# Patient Record
Sex: Female | Born: 1990 | Race: White | Hispanic: No | Marital: Single | Smoking: Never smoker
Health system: Southern US, Community
[De-identification: ages and names within clinical notes are randomized; demographics above are authoritative.]

## PROBLEM LIST (undated history)

## (undated) DIAGNOSIS — B399 Histoplasmosis, unspecified: Secondary | ICD-10-CM

## (undated) DIAGNOSIS — J45909 Unspecified asthma, uncomplicated: Secondary | ICD-10-CM

## (undated) HISTORY — DX: Histoplasmosis, unspecified: B39.9

## (undated) HISTORY — DX: Unspecified asthma, uncomplicated: J45.909

---
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
## 2012-08-27 DIAGNOSIS — B399 Histoplasmosis, unspecified: Secondary | ICD-10-CM

## 2012-08-27 HISTORY — DX: Histoplasmosis, unspecified: B39.9

## 2013-01-06 ENCOUNTER — Other Ambulatory Visit: Payer: Self-pay | Admitting: Oncology

## 2013-01-06 ENCOUNTER — Encounter: Payer: Self-pay | Admitting: Family Medicine

## 2013-01-06 ENCOUNTER — Ambulatory Visit (INDEPENDENT_AMBULATORY_CARE_PROVIDER_SITE_OTHER): Payer: 59 | Admitting: Family Medicine

## 2013-01-06 VITALS — BP 92/64 | Temp 98.1°F | Ht 68.25 in | Wt 143.0 lb

## 2013-01-06 DIAGNOSIS — J4520 Mild intermittent asthma, uncomplicated: Secondary | ICD-10-CM | POA: Insufficient documentation

## 2013-01-06 DIAGNOSIS — R222 Localized swelling, mass and lump, trunk: Secondary | ICD-10-CM | POA: Insufficient documentation

## 2013-01-06 DIAGNOSIS — R599 Enlarged lymph nodes, unspecified: Secondary | ICD-10-CM

## 2013-01-06 DIAGNOSIS — J45909 Unspecified asthma, uncomplicated: Secondary | ICD-10-CM

## 2013-01-06 DIAGNOSIS — R591 Generalized enlarged lymph nodes: Secondary | ICD-10-CM | POA: Insufficient documentation

## 2013-01-06 NOTE — Progress Notes (Signed)
Chief Complaint  Patient presents with  . Establish Care    HPI:  Laura Clarke is here to establish care. Recently moved here. 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 -currently feels fine, occ soreness in lymph node in neck and fatigue, but works night shift - feels like node in neck have enlarged a little -wants to see heme/onc here -denies: fevers, chills, weight changes, SOB, anorexia, night sweats, CP, irregular menses, skin rashes, nausea, vomiting, diarrhea, cough -works in health care, travel in the past includes [place], [place], [place] and [place] > 4 years ago   CT scan abd and chest report from 01/31/12: -2.9 cm partially calcified right peritracheal mass/lymph node, few small lymph nodes in left pericaval region up to 7 cm, wispy density 1.5 cm in R upper lobe -CBC and CMP from 01/2012 essentially normal with very mild elevation in t bili  CT neck 11/2011: -enlarged nodes in right supraclavicular space with prominent right paratracheal lymph node.  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 Asthma, mild intermittent 01/06/2013  . Peritracheal mass 01/06/2013  . Lymphadenopathy 01/06/2013   Health Maintenance: -last physical in 2009 with normal pap - has appt with gyn tomorrow  ROS:  See pertinent positives and negatives per HPI.  Past Medical History  Diagnosis Date  . Asthma     Family History  Problem Relation Age of Onset  . Arthritis Maternal Grandfather   . Hyperlipidemia Maternal Grandfather   . Heart disease Maternal Grandfather   . Arthritis Maternal Grandmother   . Hyperlipidemia Maternal Grandmother   . Hypertension Maternal Grandmother   . Hyperlipidemia Mother   . Heart disease Maternal Uncle   . Hypertension Father   . Cancer Father     basal cell skin cancer    History   Social History  . Marital Status: Single    Spouse Name: N/A    Number of Children: N/A  . Years of Education: N/A   Social History Main Topics  . Smoking status: None  . Smokeless tobacco: None  . Alcohol Use: Yes     Comment: 1 drink rarely  . Drug Use: None  . Sexually Active: Not Currently     Comment: never sexually active   Other Topics Concern  . 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 Situation: lives alone      Spiritual Beliefs: Roman Catholic      Lifestyle: goes to the gym about 3-4 times per week, eats healthy             Current outpatient prescriptions:Multiple Vitamins-Minerals (CENTRUM PO), Take by mouth., Disp: , Rfl: ;  albuterol (PROVENTIL HFA) 108 (90 BASE) MCG/ACT inhaler, Inhale 2 puffs into the lungs every 6 (six) hours as needed for wheezing., Disp: , Rfl:   EXAM:  Filed Vitals:   01/06/13 0807  BP: 92/64  Temp: 98.1 F (36.7 C)    Body mass index is 21.57 kg/(m^2).  GENERAL: vitals reviewed and listed above, alert, oriented, appears well hydrated and in no acute distress  HEENT: atraumatic, conjunttiva clear, no obvious abnormalities on inspection of external nose and ears  NECK: R supraclavicular, lowe ant cervical LAD, mobile, non-tender  Lymph: no axillary or other nodes appreciated  LUNGS: clear to auscultation bilaterally, no wheezes, rales or  rhonchi, good air movement  CV: HRRR, no peripheral edema  MS: moves all extremities without noticeable abnormality  PSYCH: pleasant and cooperative, no obvious depression or anxiety  ASSESSMENT AND PLAN:  Discussed the following assessment and plan:  1. Asthma, mild intermittent -will refill albuterol as needed  2.Peritracheal mass - Plan: Ambulatory referral to Hematology / Oncology Lymphadenopathy - Plan: Ambulatory referral to Hematology / Oncology -per patient request will have her see heme onc here for ongoing LAD, fatigue and inconclusive workup in the past -offered imaging and labs here, but she would prefer to wait to see oncologist for this -follow up to let me know how workup goes - will await records, advised she likely will need repeat imaging and lab work -records scanned in   -We reviewed the PMH, PSH, FH, SH, Meds and Allergies. -We provided refills for any medications we will prescribe as needed. -We addressed current concerns per orders and patient instructions. -We have asked for records for pertinent exams, studies, vaccines and notes from previous providers. -We have advised patient to follow up per instructions below.   -Patient advised to return or notify a doctor immediately if symptoms worsen or persist or new concerns arise.  Patient Instructions  -We have ordered labs or studies at this visit. It can take up to 1-2 weeks for results and processing. We will contact you with instructions IF your results are abnormal. Normal results will be released to your [facility]. If you have not heard from [place] or can not find your results in [facility] in 2 weeks please contact our office.  -PLEASE SIGN UP FOR MYCHART TODAY   We recommend the following healthy lifestyle measures: - eat a healthy diet consisting of lots of vegetables, fruits, beans, nuts, seeds, healthy meats such as white chicken and fish and whole grains.  - avoid fried foods, fast food, processed foods,  sodas, red meet and other fattening foods.  - get a least 150 minutes of aerobic exercise per week.   Follow up in:      Kriste Basque R.

## 2013-01-06 NOTE — Patient Instructions (Addendum)
-  We placed a referral for you as discussed to the oncologist. It usually takes about 1-2 weeks to process and schedule this referral. If you have not heard from [place] regarding this appointment in 2 weeks please contact our office.  -PLEASE SIGN UP FOR MYCHART TODAY   We recommend the following healthy lifestyle measures: - eat a healthy diet consisting of lots of vegetables, fruits, beans, nuts, seeds, healthy meats such as white chicken and fish and whole grains.  - avoid fried foods, fast food, processed foods, sodas, red meet and other fattening foods.  - get a least 150 minutes of aerobic exercise per week.   Follow up in: 3 months

## 2013-01-07 ENCOUNTER — Telehealth: Payer: Self-pay | Admitting: Oncology

## 2013-01-07 NOTE — Telephone Encounter (Signed)
LVOM FOR PT TO RETURN CALL IN RE NP APPT. S/w pt in re np appt 06/11 @ 1:30 w/Dr. Arbutus Ped

## 2013-01-23 ENCOUNTER — Other Ambulatory Visit: Payer: Self-pay | Admitting: Oncology

## 2013-01-23 DIAGNOSIS — R591 Generalized enlarged lymph nodes: Secondary | ICD-10-CM

## 2013-01-30 ENCOUNTER — Ambulatory Visit (HOSPITAL_BASED_OUTPATIENT_CLINIC_OR_DEPARTMENT_OTHER): Payer: 59 | Admitting: Oncology

## 2013-01-30 ENCOUNTER — Other Ambulatory Visit (HOSPITAL_BASED_OUTPATIENT_CLINIC_OR_DEPARTMENT_OTHER): Payer: 59 | Admitting: Lab

## 2013-01-30 ENCOUNTER — Telehealth: Payer: Self-pay | Admitting: Oncology

## 2013-01-30 ENCOUNTER — Ambulatory Visit (HOSPITAL_BASED_OUTPATIENT_CLINIC_OR_DEPARTMENT_OTHER): Payer: 59

## 2013-01-30 ENCOUNTER — Encounter: Payer: Self-pay | Admitting: Oncology

## 2013-01-30 VITALS — BP 119/83 | HR 64 | Temp 97.0°F | Resp 18 | Ht 68.0 in | Wt 143.0 lb

## 2013-01-30 DIAGNOSIS — R599 Enlarged lymph nodes, unspecified: Secondary | ICD-10-CM

## 2013-01-30 DIAGNOSIS — R591 Generalized enlarged lymph nodes: Secondary | ICD-10-CM

## 2013-01-30 LAB — CBC WITH DIFFERENTIAL/PLATELET
Basophils Absolute: 0 10*3/uL (ref 0.0–0.1)
EOS%: 0.8 % (ref 0.0–7.0)
HCT: 40.3 % (ref 34.8–46.6)
HGB: 13.9 g/dL (ref 11.6–15.9)
MCH: 30.3 pg (ref 25.1–34.0)
MCV: 88 fL (ref 79.5–101.0)
MONO%: 7.4 % (ref 0.0–14.0)
NEUT%: 57.7 % (ref 38.4–76.8)
Platelets: 184 10*3/uL (ref 145–400)
lymph#: 2.6 10*3/uL (ref 0.9–3.3)

## 2013-01-30 LAB — COMPREHENSIVE METABOLIC PANEL (CC13)
AST: 19 U/L (ref 5–34)
BUN: 14 mg/dL (ref 7.0–26.0)
Calcium: 9.8 mg/dL (ref 8.4–10.4)
Chloride: 104 mEq/L (ref 98–107)
Creatinine: 0.8 mg/dL (ref 0.6–1.1)

## 2013-01-30 NOTE — Progress Notes (Signed)
Checked in new patient. She doesn't have a POA/living will at all. No financial issues.

## 2013-01-30 NOTE — Progress Notes (Signed)
Reason for Referral: Neck adenopathy   HPI: This is a 22 year old woman native of [place] and have relocated to [place] within the last year. She is a healthy woman with past medical history significant for asthma but no chronic comorbid conditions. In November of 2012 she started noticing an enlarged lymph node in the right supraclavicular area. At that time she was living in [place] and underwent an evaluation including ENT visit and a fine needle aspiration of the enlarged lymph nodes. She had a CT scan of the neck without contrast done on 12/07/2011 which showed an aggregation of the enlarged lymph node in the right supraclavicular space associated with a prominent right paratracheal lymph node in the superior mediastinum the largest is measuring 2.6 x 1.6 cm. The superior mediastinal node measuring 1.9 cm. The biopsy was done on 12/18/2011 and was interpreted at [facility] in [place]. The final pathology showed necrotizing granulomatous inflammation without any evidence of any malignancy. At that time and she was noticing symptoms of fatigue tiredness and possibly weight loss. 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 And had a CT scan of the chest abdomen and pelvis done and on June of 2013 which showed a 2.9 cm partially calcified right paratracheal lymph node but otherwise normal CT scan. Per her report she was worked up for Scratch fever on things in her workup was unremarkable. Her laboratory testing was completely normal and her sedimentation rate was normal so is her CBC and chemistries.  Most recently, she relocated to [facility] where she currently working as a Engineer, civil (consulting) at [employer] and establish care with a primary care physician who referred her back here for further evaluation of these enlarged lymph nodes.  Clinically she feels well, she have been able to perform most  activities of daily living without any hindrance or decline. She is working full-time as a Engineer, civil (consulting) without any major difficulties. She does not report any fevers, chills, night sweats, weight loss. She does report slightly enlarged supraclavicular lymph node but has not dramatically changed. She does not report any respiratory or cardiovascular symptoms. And for the most part asymptomatic.   Past Medical History  Diagnosis Date  . Asthma   :  Past Surgical History  Procedure Laterality Date  . Wisdom tooth extraction  2004  . Removal of blocked salivary gland in lip  2008  : Current Outpatient Prescriptions  Medication Sig Dispense Refill  . albuterol (PROVENTIL HFA) 108 (90 BASE) MCG/ACT inhaler Inhale 2 puffs into the lungs every 6 (six) hours as needed for wheezing.      . Multiple Vitamins-Minerals (CENTRUM PO) Take by mouth.       No current facility-administered medications for this visit.      Allergies  Allergen Reactions  . Cefaclor Hives  :  Family History  Problem Relation Age of Onset  . Arthritis Maternal Grandfather   . Hyperlipidemia Maternal Grandfather   . Heart disease Maternal Grandfather   . Arthritis Maternal Grandmother   . Hyperlipidemia Maternal Grandmother   . Hypertension Maternal Grandmother   . Hyperlipidemia Mother   . Heart disease Maternal Uncle   . Hypertension Father   . Cancer Father     basal cell skin cancer  :  History   Social History  . Marital Status: Single    Spouse Name: N/A    Number of Children: N/A  .  Years of Education: N/A   Occupational History  . Not on file.   Social History Main Topics  . Smoking status: Not on file  . Smokeless tobacco: Not on file  . Alcohol Use: Yes     Comment: 1 drink rarely  . Drug Use: Not on file  . Sexually Active: Not Currently     Comment: never sexually active   Other Topics Concern  . 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  Exam: ECOG 0 Blood pressure 119/83, pulse 64, temperature 97 F (36.1 C), temperature source Oral, resp. rate 18, height 5\' 8"  (1.727 m), weight 143 lb (64.864 kg), last menstrual period 12/31/2012. General appearance: alert, cooperative and appears stated age Head: Normocephalic, without obvious abnormality, atraumatic Neck: no adenopathy, no carotid bruit, no JVD, supple, symmetrical, trachea midline and thyroid not enlarged, symmetric, no tenderness/mass/nodules Resp: clear to auscultation bilaterally Chest wall: no tenderness Cardio: regular rate and rhythm, S1, S2 normal, no murmur, click, rub or gallop GI: soft, non-tender; bowel sounds normal; no masses,  no organomegaly Extremities: extremities normal, atraumatic, no cyanosis or edema Pulses: 2+ and symmetric Skin: Skin color, texture, turgor normal. No rashes or lesions Lymph nodes: Cervical, supraclavicular, and axillary nodes normal.   Recent Labs  01/30/13 1037  WBC 7.6  HGB 13.9  HCT 40.3  PLT 184    Recent Labs  01/30/13 1037  NA 139  K 3.8  CL 104  CO2 26  GLUCOSE 79  BUN 14.0  CREATININE 0.8  CALCIUM 9.8    Assessment and Plan:   22 year old woman with chronically enlarged, calcified supraclavicular, mediastinal and paratracheal lymphadenopathy. This is dating back to 09/2010 and a workup including CT scan of the neck as well as CT scan chest abdomen and pelvis failed to show an enlarging lymph glands. She had a fine needle aspiration of the supraclavicular lymph nodes and showed necrotizing granulomatous disease. No evidence of malignancy was found by imaging or pathological criteria. The differential diagnosis was discussed today with Ms. Badger including malignant causes of enlarged  lymph glands such as metastatic head and neck cancer or lymphoma. The most common kind of lymphoma in her age group would be Hodgkin's disease and overall her clinical picture goes against lymphoma in general and Hodgkin's disease specifically. Other things on the differential includes sarcoidosis and other granulomatous disease that could because chronically enlarged lymph glands. Fungal infection would be a possibility although fungal cultures were negative from the last biopsy in April of 2013.  In terms of management: I recommended a repeat CT scan of the neck and chest and compare these lymph glands to the previous CT scan from our year ago. They are stable in size am inclined to continue with observation at this time and they are indeed enlarging or she is developing symptoms, then a more inclined to do a repeat biopsy. That biopsy will be in the form of an excisional neck node biopsy or with the help of a mediastinoscopy for a better diagnosis.  All her questions were answered today and she will followup in  3 months time as she elected to walk more with a conservative approach and repeat CT scan for the time being

## 2013-01-30 NOTE — Telephone Encounter (Signed)
Gave pt appt for Ct June 2014 and lab and MD on September 2014

## 2013-02-04 ENCOUNTER — Ambulatory Visit (HOSPITAL_COMMUNITY)
Admission: RE | Admit: 2013-02-04 | Discharge: 2013-02-04 | Disposition: A | Discharge status: Home / Self Care | Payer: 59 | Source: Ambulatory Visit | Attending: Oncology | Admitting: Oncology

## 2013-02-04 DIAGNOSIS — R5383 Other fatigue: Secondary | ICD-10-CM | POA: Insufficient documentation

## 2013-02-04 DIAGNOSIS — R5381 Other malaise: Secondary | ICD-10-CM | POA: Insufficient documentation

## 2013-02-04 DIAGNOSIS — R591 Generalized enlarged lymph nodes: Secondary | ICD-10-CM

## 2013-02-04 DIAGNOSIS — M542 Cervicalgia: Secondary | ICD-10-CM | POA: Insufficient documentation

## 2013-02-04 DIAGNOSIS — R599 Enlarged lymph nodes, unspecified: Secondary | ICD-10-CM | POA: Insufficient documentation

## 2013-02-04 MED ORDER — IOHEXOL 300 MG/ML  SOLN
80.0000 mL | Freq: Once | INTRAMUSCULAR | Status: AC | PRN
  Administered 2013-02-04: 80 mL via INTRAVENOUS

## 2013-02-05 ENCOUNTER — Telehealth: Payer: Self-pay | Admitting: *Deleted

## 2013-02-05 NOTE — Telephone Encounter (Signed)
Lm informing the pt that her appt time had changed. gv appt d/t for 05/05/13 starting @ 12:45pm. i also made the pt aware that i will mail a letter/cal as well...td

## 2013-05-05 ENCOUNTER — Other Ambulatory Visit: Payer: 59 | Admitting: Lab

## 2013-05-05 ENCOUNTER — Ambulatory Visit (HOSPITAL_BASED_OUTPATIENT_CLINIC_OR_DEPARTMENT_OTHER): Payer: 59 | Admitting: Oncology

## 2013-05-05 ENCOUNTER — Ambulatory Visit: Payer: 59 | Admitting: Oncology

## 2013-05-05 ENCOUNTER — Telehealth: Payer: Self-pay | Admitting: Oncology

## 2013-05-05 ENCOUNTER — Other Ambulatory Visit (HOSPITAL_BASED_OUTPATIENT_CLINIC_OR_DEPARTMENT_OTHER): Payer: 59 | Admitting: Lab

## 2013-05-05 VITALS — BP 111/75 | HR 73 | Temp 97.7°F | Resp 19 | Ht 68.0 in | Wt 138.7 lb

## 2013-05-05 DIAGNOSIS — R591 Generalized enlarged lymph nodes: Secondary | ICD-10-CM

## 2013-05-05 DIAGNOSIS — L980 Pyogenic granuloma: Secondary | ICD-10-CM

## 2013-05-05 LAB — COMPREHENSIVE METABOLIC PANEL (CC13)
ALT: 10 U/L (ref 0–55)
AST: 17 U/L (ref 5–34)
Alkaline Phosphatase: 40 U/L (ref 40–150)
Calcium: 9.4 mg/dL (ref 8.4–10.4)
Chloride: 103 mEq/L (ref 98–109)
Creatinine: 0.8 mg/dL (ref 0.6–1.1)
Total Bilirubin: 1.9 mg/dL — ABNORMAL HIGH (ref 0.20–1.20)

## 2013-05-05 LAB — CBC WITH DIFFERENTIAL/PLATELET
BASO%: 0.6 % (ref 0.0–2.0)
EOS%: 1 % (ref 0.0–7.0)
HCT: 40.5 % (ref 34.8–46.6)
MCH: 30 pg (ref 25.1–34.0)
MCHC: 34 g/dL (ref 31.5–36.0)
NEUT%: 52.7 % (ref 38.4–76.8)
lymph#: 2.9 10*3/uL (ref 0.9–3.3)

## 2013-05-05 NOTE — Progress Notes (Signed)
Hematology and Oncology Follow Up Visit  Laura Clarke [id number] Nov 29, 1990 22 y.o. 05/05/2013 1:02 PM   Principle Diagnosis: 22 year old woman with a borderline lymphadenopathy most likely granulomatous disease and less likely lymphoproliferative disorder.   Current therapy: Observation and surveillance.  Interim History: Laura Clarke presents today for a followup visit. She is a pleasant woman I evaluated initially on June of 2014 with chronic lymphadenopathy the dates back to 2012. She have had a biopsy of a paratracheal lymph node in 2013 which suggested necrotizing granulomatous inflammation without any malignancy. She established care in [place] and presents today for a followup visit. Since her last visit, she is asymptomatic. Has not reported any fevers, chills or weight loss. Has not reported any bulky adenopathy or early satiety. 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  Medications: I have reviewed the patient's current medications.  Current Outpatient Prescriptions  Medication Sig Dispense Refill  . albuterol (PROVENTIL HFA) 108 (90 BASE) MCG/ACT inhaler Inhale 2 puffs into the lungs every 6 (six) hours as needed for wheezing.      . Multiple Vitamins-Minerals (CENTRUM PO) Take by mouth.       No current facility-administered medications for this visit.     Allergies:  Allergies  Allergen Reactions  . Cefaclor Hives    Past Medical History, Surgical history, Social history, and Family History were reviewed and updated.  Review of Systems: Remaining ROS negative. Physical Exam: Blood pressure 111/75, pulse 73, temperature 97.7 F (36.5 C), temperature source Oral, resp. rate 19, height 5\' 8"  (1.727 m), weight 138 lb 11.2 oz (62.914 kg), SpO2 100.00%. ECOG: 0 General appearance: alert Head: Normocephalic, without obvious abnormality, atraumatic Neck: no adenopathy, no carotid bruit, no JVD, supple, symmetrical,  trachea midline and thyroid not enlarged, symmetric, no tenderness/mass/nodules Lymph nodes: Cervical, supraclavicular, and axillary nodes normal. Heart:regular rate and rhythm, S1, S2 normal, no murmur, click, rub or gallop Lung:chest clear, no wheezing, rales, normal symmetric air entry, Heart exam - S1, S2 normal, no murmur, no gallop, rate regular Abdomin: soft, non-tender, without masses or organomegaly EXT:no erythema, induration, or nodules   Lab Results: Lab Results  Component Value Date   WBC 7.4 05/05/2013   HGB 13.8 05/05/2013   HCT 40.5 05/05/2013   MCV 88.2 05/05/2013   PLT 174 05/05/2013     Chemistry      Component Value Date/Time   NA 139 01/30/2013 1037   K 3.8 01/30/2013 1037   CL 104 01/30/2013 1037   CO2 26 01/30/2013 1037   BUN 14.0 01/30/2013 1037   CREATININE 0.8 01/30/2013 1037      Component Value Date/Time   CALCIUM 9.8 01/30/2013 1037   ALKPHOS 45 01/30/2013 1037   AST 19 01/30/2013 1037   ALT 12 01/30/2013 1037   BILITOT 1.39* 01/30/2013 1037       Radiological Studies:  CT NECK AND CHEST WITHOUT CONTRAST  Technique: Multidetector CT imaging of the neck and chest was  performed using the standard protocol without intravenous contrast.  Comparison: None.  CT NECK  Findings: Pharyngeal soft tissues are unremarkable. Airway remains  patent.  Small bilateral level IIA lymph nodes measuring up to 6 mm short  axis (series 8/images and 72), within normal limits.  Visualized brain parenchyma is unremarkable.  The visualized paranasal sinuses are essentially clear. The mastoid  air cells are unopacified.  Cervical spine is within normal limits.  Visualized thyroid is unremarkable.  IMPRESSION:  Small bilateral cervical lymph nodes measuring up to 6 mm short  axis, within normal limits.  CT CHEST  Findings: Lungs are essentially clear. No suspicious pulmonary  nodules. Mild tree-in-bud nodularity in the medial right upper lobe  (series 4/images 20-24), likely  postinfectious/inflammatory. Mild  dependent atelectasis in the left lower lobe. No pleural effusion  or pneumothorax.  The heart is normal in size. No pericardial effusion.  10 mm short axis partially calcified right supraclavicular node  (series 3/image 9). 14 mm short axis partially calcified right  paratracheal node (series 3/image 19). 11 mm short axis partially  calcified right hilar node (series 3/image 26).  Visualized upper abdomen is notable for calcified splenic  granulomata.  Visualized osseous structures are within normal limits.  IMPRESSION:  Borderline enlarged right supraclavicular, right paratracheal, and  right hilar lymph nodes, partially calcified, likely sequela of  prior granulomatous disease.  Associated calcified splenic granulomata.  Impression and Plan:   22 year old woman with chronically enlarged, calcified supraclavicular, mediastinal and paratracheal lymphadenopathy. This is dating back to 09/2010 and a workup including CT scan of the neck as well as CT scan chest abdomen and pelvis failed to show an enlarging lymph glands. She had a fine needle aspiration of the supraclavicular lymph nodes and showed necrotizing granulomatous disease. No evidence of malignancy was found by imaging or pathological criteria. CT scan of the neck and chest obtained June of 2014 confirmed these findings as well. This was discussed today in detail with the patient and I feel that no further intervention is needed. She is completely asymptomatic and I see no indication for a biopsy which would probably require an invasive procedure. We'll continue active surveillance and followup in 6 months.   [facility], MD 9/9/20141:02 PM

## 2013-05-05 NOTE — Telephone Encounter (Signed)
gv and printed appt sched and avs forpt for March 2015  °

## 2013-05-28 ENCOUNTER — Other Ambulatory Visit: Payer: Self-pay | Admitting: Nurse Practitioner

## 2013-05-28 ENCOUNTER — Encounter: Payer: Self-pay | Admitting: Nurse Practitioner

## 2013-05-28 ENCOUNTER — Ambulatory Visit (HOSPITAL_BASED_OUTPATIENT_CLINIC_OR_DEPARTMENT_OTHER)
Admission: RE | Admit: 2013-05-28 | Discharge: 2013-05-28 | Disposition: A | Discharge status: Home / Self Care | Payer: 59 | Source: Ambulatory Visit | Attending: Nurse Practitioner | Admitting: Nurse Practitioner

## 2013-05-28 ENCOUNTER — Ambulatory Visit (INDEPENDENT_AMBULATORY_CARE_PROVIDER_SITE_OTHER): Payer: 59 | Admitting: Nurse Practitioner

## 2013-05-28 VITALS — BP 92/58 | HR 85 | Temp 97.9°F | Ht 68.25 in | Wt 138.0 lb

## 2013-05-28 DIAGNOSIS — J8410 Pulmonary fibrosis, unspecified: Secondary | ICD-10-CM

## 2013-05-28 DIAGNOSIS — J984 Other disorders of lung: Secondary | ICD-10-CM

## 2013-05-28 DIAGNOSIS — R1012 Left upper quadrant pain: Secondary | ICD-10-CM

## 2013-05-28 DIAGNOSIS — R590 Localized enlarged lymph nodes: Secondary | ICD-10-CM

## 2013-05-28 DIAGNOSIS — R599 Enlarged lymph nodes, unspecified: Secondary | ICD-10-CM

## 2013-05-28 DIAGNOSIS — R109 Unspecified abdominal pain: Secondary | ICD-10-CM | POA: Insufficient documentation

## 2013-05-28 NOTE — Patient Instructions (Signed)
I am not certain what is causing your abdominal pain. It may be related to lung nodule, esophagitis, or other cause. Our office will call you with results of scan & labs. We will consider neuro consult regarding migraine.  Pleasure to meet you!

## 2013-05-28 NOTE — Progress Notes (Signed)
Subjective:     Laura Clarke is a 22 y.o. female who presents for evaluation of abdominal pain. Onset was 3 days ago. Symptoms have been gradually improving. The pain is described as aching, dull and changed from sharp p[ain initially, associated w/deep inspiration., and is 3/10 in intensity. Pain intially experienced on LUQ, now is apread across abdomen.  Aggravating factors: recumbency and deep inspiration.  Alleviating factors: none. Pt thought pain might be esophagitis, so she started taking lansoprazole 15 mg 3 da w/no relief. Associated symptoms: belching, and migraine HA lasting 24 hours, 2 da, associated w/R eye tearing & lid droop and blurred vision in L eye. Took tylenol without relief. Woke this am with no pain. The patient denies chills, constipation, diarrhea, dysuria, fever and flatus. Of note, pt recently worked up for supraclavicular, hilar, and lung granulomas. I have reviewed PCP and oncology notes. Recent w/u has consisted of chest CT, remote w/u included fine needle aspiration & CT of abdomen 2 ya.  The patient's history has been marked as reviewed and updated as appropriate.  Review of Systems Constitutional: negative for anorexia, chills, fatigue, fevers, night sweats and weight loss Eyes: positive for contacts/glasses, visual disturbance and R eye tearing & ptosis associated w/recent MHA, negative for redness Ears, nose, mouth, throat, and face: negative for epistaxis, nasal congestion, sore throat and voice change Respiratory: positive for asthma, cough, dyspnea on exertion and hilar LAD, calcified lung nodule, negative for wheezing Cardiovascular: negative for chest pain, chest pressure/discomfort, irregular heart beat, lower extremity edema and near-syncope Gastrointestinal: positive for abdominal pain and belching, negative for change in bowel habits, dyspepsia, nausea, odynophagia, reflux symptoms and vomiting Genitourinary:negative for abnormal menstrual periods, dysuria  and frequency Hematologic/lymphatic: positive for per chest CT: R [place] LAD, R hilar LAD, R paratracheal LAD, calcified splenic granulomata Neurological: positive for recent migraine lasting 24 hrs w/blurred vision, ptosis, & tearing of R eye, negative for dizziness, gait problems, memory problems, seizures, speech problems, vertigo and weakness Allergic/Immunologic: positive for allergies & asthma as child     Objective:    BP 92/58  Pulse 85  Temp(Src) 97.9 F (36.6 C) (Oral)  Ht 5' 8.25" (1.734 m)  Wt 138 lb (62.596 kg)  BMI 20.82 kg/m2  SpO2 97%  LMP 05/24/2013 General appearance: alert, cooperative, appears stated age and no distress Head: Normocephalic, without obvious abnormality, atraumatic Eyes: negative findings: lids and lashes normal, conjunctivae and sclerae normal, corneas clear and pupils equal, round, reactive to light and accomodation, R eyelid ptosis Ears: normal TM's and external ear canals both ears Nose: Nares normal. Septum midline. Mucosa normal. No drainage or sinus tenderness. Throat: lips, mucosa, and tongue normal; teeth and gums normal Neck: no carotid bruit, supple, symmetrical, trachea midline, thyroid not enlarged, symmetric, no tenderness/mass/nodules and I cannot appreciate [place] LAD on exam Lungs: clear to auscultation bilaterally Heart: regular rate and rhythm, S1, S2 normal, no murmur, click, rub or gallop Abdomen: RUQ & LUQ tenderness, No HSM, no guarding Lymph nodes: Cervical, supraclavicular, and axillary nodes normal. Neurologic: Alert and oriented X 3, normal strength and tone. Normal symmetric reflexes. Normal coordination and gait    Assessment:   1 abdominal pai, recent elevated bilirubin XB:[id number], partial intestinal blockage, GB 2 recent HA w/ptosis & tearing 3 remote & recent findings of [place], hilar, & paratracheal LAD, & splenic granulomata. DD: infectious granulomatosis- went to college in [place] (histoplasmosis); AI-wegner's, sarcoid;  neoplastic-Hodgkin's.  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

## 2013-05-29 ENCOUNTER — Telehealth: Payer: Self-pay | Admitting: Nurse Practitioner

## 2013-05-29 LAB — ANA: Anti Nuclear Antibody(ANA): NEGATIVE

## 2013-05-29 LAB — SEDIMENTATION RATE: Sed Rate: 4 mm/hr (ref 0–22)

## 2013-05-29 NOTE — Telephone Encounter (Signed)
Abd [place] essentially nml. lg amt gas-may explain discomfort. Spleen shows multiple foci-c/w chest CT showing calcified granulomata. 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 Histoplas AB are pending.

## 2013-06-01 ENCOUNTER — Telehealth: Payer: Self-pay | Admitting: *Deleted

## 2013-06-01 LAB — PAN-ANCA
Atypical p-ANCA Screen: NEGATIVE
Myeloperoxidase Abs: <1 AU/mL (ref <20)
Serine Protease 3: <1 AU/mL (ref <20)
p-ANCA Screen: NEGATIVE

## 2013-06-01 NOTE — Telephone Encounter (Signed)
Patient left vm stating that she was returning your call concerning results.

## 2013-06-02 ENCOUNTER — Telehealth: Payer: Self-pay | Admitting: *Deleted

## 2013-06-02 NOTE — Telephone Encounter (Signed)
Patient called about results from 05/28/13. Please advise?

## 2013-06-02 NOTE — Progress Notes (Signed)
c anca resulted negative

## 2013-06-03 ENCOUNTER — Telehealth: Payer: Self-pay | Admitting: Nurse Practitioner

## 2013-06-03 NOTE — Telephone Encounter (Signed)
Pt states abd pain is resolving. Discussed bowel gas burden seen on US-possible lactose intolerance. Pt will monitor diet. Discussed spleen findings which are c/w abd CT & remote  histpl infection. Still waiting on histopl ab.  Will call pt when recv results.

## 2013-06-08 ENCOUNTER — Telehealth: Payer: Self-pay | Admitting: Nurse Practitioner

## 2013-06-08 DIAGNOSIS — B399 Histoplasmosis, unspecified: Secondary | ICD-10-CM

## 2013-06-08 NOTE — Telephone Encounter (Signed)
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 Pt has no current symptoms. Will refer to pulm for consult, may refer to ID also. Abdominal pain has completely resolved w/out intervention. Pt states R eye droop has resolved ( she had cluster HA day before last appt.). All discussed w/pt. See ph note.

## 2013-06-09 NOTE — Telephone Encounter (Signed)
Patient contacted

## 2013-06-12 ENCOUNTER — Encounter: Payer: Self-pay | Admitting: Internal Medicine

## 2013-06-12 ENCOUNTER — Ambulatory Visit (INDEPENDENT_AMBULATORY_CARE_PROVIDER_SITE_OTHER): Payer: 59 | Admitting: Internal Medicine

## 2013-06-12 VITALS — BP 122/82 | HR 66 | Ht 68.0 in | Wt 137.0 lb

## 2013-06-12 DIAGNOSIS — J45909 Unspecified asthma, uncomplicated: Secondary | ICD-10-CM

## 2013-06-12 DIAGNOSIS — J4520 Mild intermittent asthma, uncomplicated: Secondary | ICD-10-CM

## 2013-06-12 DIAGNOSIS — R599 Enlarged lymph nodes, unspecified: Secondary | ICD-10-CM

## 2013-06-12 DIAGNOSIS — R591 Generalized enlarged lymph nodes: Secondary | ICD-10-CM

## 2013-06-12 NOTE — Progress Notes (Signed)
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   06/12/2013 1st [facility] office visit/ Maddalynn Barnard cc neck swelling assoc with fatigue and sweats, completely resolved x 6 m s arthralgias or wt loss. No cough or sob.  Does have h/o asthma but uses saba inhaler only a couple a times a month at most and no effect on ex tol  No obvious day to day or daytime variabilty or assoc chronic cough or cp or chest tightness, subjective wheeze overt sinus or hb symptoms. No unusual exp hx or h/o childhood pna/ asthma or knowledge of premature birth.  Sleeping ok without nocturnal  or early am exacerbation  of respiratory  c/o's or need for noct saba. Also denies any obvious fluctuation of symptoms with weather or environmental changes or other aggravating or alleviating factors except as outlined above   Current Medications, Allergies, Complete Past Medical History, Past Surgical History, Family History, and Social History were reviewed in [employer] record.            Review of Systems  Constitutional: Negative for fever, chills, diaphoresis, activity change, appetite change, fatigue and unexpected weight change.  HENT: Negative for congestion, dental problem, ear discharge, ear pain, facial swelling, hearing loss, mouth sores, nosebleeds, postnasal drip, rhinorrhea, sinus pressure, sneezing, sore throat, tinnitus, trouble swallowing and voice change.   Eyes: Negative for photophobia, discharge, itching and visual disturbance.  Respiratory: Negative for apnea, cough, choking, chest tightness, shortness of breath, wheezing and stridor.   Cardiovascular:  Negative for chest pain, palpitations and leg swelling.  Gastrointestinal: Negative for nausea, vomiting, abdominal pain, constipation, blood in stool and abdominal distention.  Genitourinary: Negative for dysuria, urgency, frequency, hematuria, flank pain, decreased urine volume and difficulty urinating.  Musculoskeletal: Negative for arthralgias, back pain, gait problem, joint swelling, myalgias, neck pain and neck stiffness.  Skin: Negative for color change, pallor and rash.  Neurological: Negative for dizziness, tremors, seizures, syncope, speech difficulty, weakness, light-headedness, numbness and headaches.  Hematological: Negative for adenopathy. Does not bruise/bleed easily.  Psychiatric/Behavioral: Negative for confusion, sleep disturbance and agitation. The patient is not nervous/anxious.        Objective:   Physical Exam  amb wf nad  Wt Readings from Last 3 Encounters:  06/12/13 137 lb (62.143 kg)  05/28/13 138 lb (62.596 kg)  05/05/13 138 lb 11.2 oz (62.914 kg)     HEENT: nl dentition, turbinates, and orophanx. Nl external ear canals without cough reflex   NECK :  without JVD/Nodes/TM/ nl carotid upstrokes bilaterally   LUNGS: no acc muscle use, clear to A and P bilaterally without cough on insp or exp maneuvers   CV:  RRR  no s3 or murmur or increase in P2, no edema   ABD:  soft and nontender with nl excursion in the supine position. No bruits or organomegaly, bowel sounds nl  MS:  warm without deformities, calf tenderness, cyanosis or clubbing  SKIN: warm and dry without lesions    NEURO:  alert, approp, no deficits    CT chest 02/04/13 Borderline enlarged right supraclavicular, right paratracheal, and  right hilar lymph nodes, partially calcified, likely sequela of  prior granulomatous disease.  Associated calcified splenic granulomata      Assessment & Plan:

## 2013-06-12 NOTE — Patient Instructions (Signed)
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

## 2013-06-13 NOTE — Assessment & Plan Note (Signed)
Controlled on prn saba, guidelines reviewed for step up therapy, no needed here.

## 2013-06-13 NOTE — Assessment & Plan Note (Addendum)
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  I have referred this nurse to the classic book on this dz written by two [facility]ologists but see no indication, in the absence of ongoing evidence of infection (which have resolved and the esr of only 4 reflects this) or progressive organ involvement or dysfunction or altered host response all of which are absent here, to start any antifungal rx.  Should she ever need to take immunosuppressives for any reason, other than short (5-7 days)  course of prednisone for asthma, I would refer her to ID for consideration for rx.

## 2013-11-02 ENCOUNTER — Telehealth: Payer: Self-pay | Admitting: *Deleted

## 2013-11-02 NOTE — Telephone Encounter (Signed)
Pt lm for me to cancel 11/03/13 appts...td

## 2013-11-03 ENCOUNTER — Ambulatory Visit: Payer: 59 | Admitting: Oncology

## 2013-11-03 ENCOUNTER — Other Ambulatory Visit: Payer: 59

## 2014-05-07 ENCOUNTER — Encounter: Payer: Self-pay | Admitting: Family Medicine

## 2014-05-07 ENCOUNTER — Ambulatory Visit (INDEPENDENT_AMBULATORY_CARE_PROVIDER_SITE_OTHER): Payer: 59 | Admitting: Family Medicine

## 2014-05-07 VITALS — BP 98/70 | HR 61 | Temp 96.4°F | Ht 68.0 in | Wt 133.5 lb

## 2014-05-07 DIAGNOSIS — Z569 Unspecified problems related to employment: Secondary | ICD-10-CM

## 2014-05-07 DIAGNOSIS — R591 Generalized enlarged lymph nodes: Secondary | ICD-10-CM

## 2014-05-07 DIAGNOSIS — Z566 Other physical and mental strain related to work: Secondary | ICD-10-CM

## 2014-05-07 DIAGNOSIS — Z0000 Encounter for general adult medical examination without abnormal findings: Secondary | ICD-10-CM

## 2014-05-07 DIAGNOSIS — R599 Enlarged lymph nodes, unspecified: Secondary | ICD-10-CM

## 2014-05-07 DIAGNOSIS — R634 Abnormal weight loss: Secondary | ICD-10-CM

## 2014-05-07 LAB — HEPATIC FUNCTION PANEL
ALBUMIN: 4.1 g/dL (ref 3.5–5.2)
ALT: 10 U/L (ref 0–35)
AST: 12 U/L (ref 0–37)
Alkaline Phosphatase: 36 U/L — ABNORMAL LOW (ref 39–117)
BILIRUBIN DIRECT: 0.2 mg/dL (ref 0.0–0.3)
TOTAL PROTEIN: 7.1 g/dL (ref 6.0–8.3)
Total Bilirubin: 2.1 mg/dL — ABNORMAL HIGH (ref 0.2–1.2)

## 2014-05-07 LAB — LIPID PANEL
CHOLESTEROL: 146 mg/dL (ref 0–200)
HDL: 41.3 mg/dL (ref >39.00)
LDL Cholesterol: 91 mg/dL (ref 0–99)
NonHDL: 104.7
TRIGLYCERIDES: 70 mg/dL (ref 0.0–149.0)
Total CHOL/HDL Ratio: 4
VLDL: 14 mg/dL (ref 0.0–40.0)

## 2014-05-07 LAB — CBC WITH DIFFERENTIAL/PLATELET
BASOS ABS: 0 10*3/uL (ref 0.0–0.1)
BASOS PCT: 0.3 % (ref 0.0–3.0)
Eosinophils Absolute: 0.1 10*3/uL (ref 0.0–0.7)
Eosinophils Relative: 1.1 % (ref 0.0–5.0)
HEMATOCRIT: 41.5 % (ref 36.0–46.0)
Hemoglobin: 14 g/dL (ref 12.0–15.0)
Lymphocytes Relative: 39.2 % (ref 12.0–46.0)
Lymphs Abs: 2.3 10*3/uL (ref 0.7–4.0)
MCHC: 33.8 g/dL (ref 30.0–36.0)
MCV: 89.3 fl (ref 78.0–100.0)
MONOS PCT: 8.3 % (ref 3.0–12.0)
Monocytes Absolute: 0.5 10*3/uL (ref 0.1–1.0)
NEUTROS ABS: 3.1 10*3/uL (ref 1.4–7.7)
Neutrophils Relative %: 51.1 % (ref 43.0–77.0)
Platelets: 183 10*3/uL (ref 150.0–400.0)
RBC: 4.64 Mil/uL (ref 3.87–5.11)
RDW: 13.2 % (ref 11.5–15.5)
WBC: 6 10*3/uL (ref 4.0–10.5)

## 2014-05-07 LAB — BASIC METABOLIC PANEL
BUN: 10 mg/dL (ref 6–23)
CALCIUM: 9.7 mg/dL (ref 8.4–10.5)
CO2: 30 meq/L (ref 19–32)
CREATININE: 0.8 mg/dL (ref 0.4–1.2)
Chloride: 103 mEq/L (ref 96–112)
GFR: 94.63 mL/min (ref >60.00)
Glucose, Bld: 83 mg/dL (ref 70–99)
Potassium: 4.8 mEq/L (ref 3.5–5.1)
Sodium: 137 mEq/L (ref 135–145)

## 2014-05-07 NOTE — Progress Notes (Signed)
No chief complaint on file.   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, with yearly follow up -reports:doing well, feel well -denies: night sweat, cough, weight loss  2) Weight loss: -noted on chart, mild -reports this is from stress/schedule at work and does not eat well when on night shifts -denies: vomiting, diarrhea, change in bowels, fatigue, malaise, cough, night sweats  -Diet: variety of foods, balance and well rounded  -Exercise: regular exercise - gym, yoga, hiking  -Taking folic acid, vitamin D or calcium: no  -Diabetes and Dyslipidemia Screening:   -Hx of HTN: no  -Vaccines: UTD  -pap history: had pap with carol curtis and was normal 2 months ago  -FDLMP: August 28th and normal  -sexual activity: yes, female partner, no new partners  -wants STI testing: no  -FH breast, colon or ovarian ca: see FH  Breast Ca Risk Assessment: No personal of FH breast or ovarian cancer   -Alcohol, Tobacco, drug use: see social history  Review of Systems - no fevers, unintentional weight loss, vision loss, hearing loss, chest pain, sob, hemoptysis, melena, hematochezia, hematuria, genital discharge, changing or concerning skin lesions, bleeding, bruising, loc, thoughts of self harm or SI  Past Medical History  Diagnosis Date  . Asthma     Past Surgical History  Procedure Laterality Date  . Wisdom tooth extraction  2004  . Removal of blocked salivary gland in lip  2008    Family History  Problem Relation Age of Onset  . Arthritis Maternal Grandfather   . Hyperlipidemia Maternal Grandfather   . Heart disease Maternal Grandfather   . Arthritis Maternal Grandmother   . Hyperlipidemia Maternal Grandmother   . Hypertension Maternal Grandmother   . Hyperlipidemia Mother   . Heart  disease Maternal Uncle   . Hypertension Father   . Cancer Father     basal cell skin cancer    History   Social History  . Marital Status: Single    Spouse Name: N/A    Number of Children: N/A  . Years of Education: N/A   Social History Main Topics  . Smoking status: Never Smoker   . Smokeless tobacco: Never Used  . Alcohol Use: Yes     Comment: 1 drink rarely  . Drug Use: No  . Sexual Activity: Not Currently     Comment: never sexually active   Other Topics Concern  . 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, civil (consulting) in step down ICU      Home Situation: lives alone      Spiritual Beliefs: Roman Catholic      Lifestyle: goes to the gym about 3-4 times per week, eats healthy             Current outpatient prescriptions:albuterol (PROVENTIL HFA) 108 (90 BASE) MCG/ACT inhaler, Inhale 2 puffs into the lungs every 6 (six) hours as needed for wheezing., Disp: , Rfl: ;  Multiple Vitamins-Minerals (CENTRUM PO), Take by mouth., Disp: , Rfl:   EXAM:  Filed Vitals:   05/07/14 1443  BP: 98/70  Pulse: 61  Temp: 96.4 F (35.8 C)    GENERAL: vitals reviewed and listed below, alert, oriented, appears well hydrated and in no acute distress  HEENT: head  atraumatic, PERRLA, normal appearance of eyes, ears, nose and mouth. moist mucus membranes.  NECK: supple, no masses or lymphadenopathy  LUNGS: clear to auscultation bilaterally, no rales, rhonchi or wheeze  CV: HRRR, no peripheral edema or cyanosis, normal pedal pulses  BREAST: declined  ABDOMEN: bowel sounds normal, soft, non tender to palpation, no masses, no rebound or guarding  GU: declined  RECTAL: refused  SKIN: no rash or abnormal lesions  MS: normal gait, moves all extremities normally  NEURO: CN II-XII grossly intact, normal muscle strength and sensation to light touch on extremities  PSYCH: normal affect, pleasant and cooperative  ASSESSMENT AND PLAN:  Discussed the  following assessment and plan:  Physical exam  Loss of weight - Plan: CBC with Differential, Basic metabolic panel, Hepatic Function Panel, Lipid Panel -noted on intake, poor calorie intake due to work shift and this started about when she started night shifts, no other alarming symptoms -labs per orders, trial of adequate calorie intake with close follow up -mild bili elevated on labs done elsewhere on [facility], recheck today  Stress at work  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  -Advised at least 150 minutes of exercise per week and a healthy diet low in saturated fats and sweets and consisting of fresh fruits and vegetables, lean meats such as fish and white chicken and whole grains.  -labs, studies and vaccines per orders this encounter  Orders Placed This Encounter  Procedures  . CBC with Differential  . Basic metabolic panel  . Hepatic Function Panel  . Lipid Panel    Patient advised to return to clinic immediately if symptoms worsen or persist or new concerns.  Patient Instructions  -Vit D3 800-1000IU daily; calcium 1200 mg a day from diet and supplement only if needed; folic acid daily  -for the weight let make sure you are eating at least 3 meals (1500-2000) calories per day and follow up in 2-3 months to recheck  -We have ordered labs or studies at this visit. It can take up to 1-2 weeks for results and processing. We will contact you with instructions IF your results are abnormal. Normal results will be released to your [facility]. If you have not heard from [place] or can not find your results in [facility] in 2 weeks please contact our office.  -PLEASE SIGN UP FOR MYCHART TODAY   We recommend the following healthy lifestyle measures: - eat a healthy diet consisting of lots of vegetables, fruits,  beans, nuts, seeds, healthy meats such as white chicken and fish and whole grains.  - avoid fried foods, fast food, processed foods, sodas, red meet and other fattening foods.  - get a least 150 minutes of aerobic exercise per week.   Follow up in: 2-3 months     No Follow-up on file.  Kriste Basque R.

## 2014-05-07 NOTE — Patient Instructions (Signed)
-  Vit D3 800-1000IU daily; calcium 1200 mg a day from diet and supplement only if needed; folic acid daily  -for the weight let make sure you are eating at least 3 meals (1500-2000) calories per day and follow up in 2-3 months to recheck  -We have ordered labs or studies at this visit. It can take up to 1-2 weeks for results and processing. We will contact you with instructions IF your results are abnormal. Normal results will be released to your [facility]. If you have not heard from [place] or can not find your results in [facility] in 2 weeks please contact our office.  -PLEASE SIGN UP FOR MYCHART TODAY   We recommend the following healthy lifestyle measures: - eat a healthy diet consisting of lots of vegetables, fruits, beans, nuts, seeds, healthy meats such as white chicken and fish and whole grains.  - avoid fried foods, fast food, processed foods, sodas, red meet and other fattening foods.  - get a least 150 minutes of aerobic exercise per week.   Follow up in: 2-3 months

## 2014-05-07 NOTE — Progress Notes (Signed)
Pre visit review using our clinic review tool, if applicable. No additional management support is needed unless otherwise documented below in the visit note. 

## 2014-05-10 ENCOUNTER — Other Ambulatory Visit: Payer: Self-pay | Admitting: *Deleted

## 2014-07-21 ENCOUNTER — Other Ambulatory Visit: Payer: 59

## 2014-10-07 ENCOUNTER — Telehealth: Payer: Self-pay | Admitting: Family Medicine

## 2014-10-07 ENCOUNTER — Encounter: Payer: Self-pay | Admitting: Family Medicine

## 2014-10-07 ENCOUNTER — Ambulatory Visit (INDEPENDENT_AMBULATORY_CARE_PROVIDER_SITE_OTHER): Payer: 59 | Admitting: Family Medicine

## 2014-10-07 VITALS — BP 92/62 | HR 64 | Temp 97.7°F | Ht 68.0 in | Wt 135.5 lb

## 2014-10-07 DIAGNOSIS — M5489 Other dorsalgia: Secondary | ICD-10-CM

## 2014-10-07 DIAGNOSIS — K529 Noninfective gastroenteritis and colitis, unspecified: Secondary | ICD-10-CM

## 2014-10-07 DIAGNOSIS — R17 Unspecified jaundice: Secondary | ICD-10-CM

## 2014-10-07 NOTE — Telephone Encounter (Signed)
Order entered

## 2014-10-07 NOTE — Progress Notes (Signed)
HPI:  Acute visit for:  GI issues: -started about 1.5 weeks ago -several days of NVD, now this has resolved -has a few days of abd pain 1.5 months ago - this resolved -denies: abd pain now, persisting NVD, weight loss, fevers, chills, malaise -FDLMP: today, normal, regular  Back Pain: -R low back -started after lifting heavy patient at work 2 weeks ago -initially she had some radiation of the pain down her R leg initially - exercises helped -now this has resolved -hx of intermittent pain in low back in the past, mild -today: mild low back pain today - but otherwise symptoms resolved -denies: weakness, numbness, bowel or bladder incontinence  ROS: See pertinent positives and negatives per HPI.  Past Medical History  Diagnosis Date  . Asthma   . 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 Wisdom tooth extraction  2004  . Removal of blocked salivary gland in lip  2008    Family History  Problem Relation Age of Onset  . Arthritis Maternal Grandfather   . Hyperlipidemia Maternal Grandfather   . Heart disease Maternal Grandfather   . Arthritis Maternal Grandmother   . Hyperlipidemia Maternal Grandmother   . Hypertension Maternal Grandmother   . Hyperlipidemia Mother   . Heart disease Maternal Uncle   . Hypertension Father   . Cancer Father     basal cell skin cancer    History   Social History  . Marital Status: Single    Spouse Name: N/A  . Number of Children: N/A  . Years of Education: N/A   Social History Main Topics  . Smoking status: Never Smoker   . Smokeless tobacco: Never Used  . Alcohol Use: Yes     Comment: 1 drink rarely  . Drug Use: No  . Sexual Activity: Not Currently     Comment: never sexually active   Other Topics Concern  . 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  albuterol (PROVENTIL HFA) 108 (90 BASE) MCG/ACT inhaler, Inhale 2 puffs into the lungs every 6 (six) hours as needed for wheezing., Disp: , Rfl:  .  Cholecalciferol (VITAMIN D PO), Take by mouth daily., Disp: , Rfl:  .  FOLIC ACID PO, Take by mouth., Disp: , Rfl:  .  NAPROXEN PO, Take by mouth daily., Disp: , Rfl:   EXAM:  Filed Vitals:   10/07/14 0910  BP: 92/62  Pulse: 64  Temp: 97.7 F (36.5 C)    Body mass index is 20.61 kg/(m^2).  GENERAL: vitals reviewed and listed above, alert, oriented, appears well hydrated and in no acute distress  HEENT: atraumatic, conjunttiva clear, no obvious abnormalities on inspection of external nose and ears  NECK: no obvious masses on inspection  LUNGS: clear to auscultation bilaterally, no wheezes, rales or rhonchi, good air movement  CV: HRRR, no peripheral edema  ABD: BS+, soft, NTTP  MS: moves all extremities without noticeable abnormality Normal Gait Normal inspection of back, no  obvious scoliosis or leg length descrepancy No bony TTP Soft tissue TTP at: minimal L lumbar paraspinal muscles -/+ tests: neg trendelenburg,-facet loading, -SLRT, -CLRT, -FABER, -FADIR Normal muscle strength, sensation to light touch and DTRs in LEs bilaterally  PSYCH: pleasant and cooperative, no obvious depression or anxiety  ASSESSMENT AND PLAN:  Discussed the following assessment and plan:  Gastroenteritis -all symptoms now resolved -follow up if symptoms recur  Right-sided back pain, unspecified location -benign exam today and most symptoms resolved -HEP, follow up 1 month  Check LFTs at follow up  -Patient advised to return or notify a doctor immediately if symptoms worsen or persist or new concerns arise.  There are  no Patient Instructions on file for this visit.   Laura Basque R.

## 2014-10-07 NOTE — Telephone Encounter (Signed)
Pt advised to fu in one mo and LFT prior to appt (per avs)  Can you put the order in ?

## 2014-10-07 NOTE — Progress Notes (Signed)
Pre visit review using our clinic review tool, if applicable. No additional management support is needed unless otherwise documented below in the visit note. 

## 2014-10-07 NOTE — Patient Instructions (Addendum)
BEFORE YOU LEAVE: -low back exercises -follow up in 1 month   Do the exercises 4 days per week  Let us know if you need a letter for lifting restrictions at work for the next week or so  Follow up sooner if concerns or worsening

## 2014-10-27 ENCOUNTER — Other Ambulatory Visit: Payer: 59

## 2014-10-28 ENCOUNTER — Other Ambulatory Visit: Payer: 59

## 2014-11-01 ENCOUNTER — Other Ambulatory Visit (INDEPENDENT_AMBULATORY_CARE_PROVIDER_SITE_OTHER): Payer: 59

## 2014-11-01 DIAGNOSIS — R17 Unspecified jaundice: Secondary | ICD-10-CM

## 2014-11-01 LAB — HEPATIC FUNCTION PANEL
ALT: 11 U/L (ref 0–35)
AST: 14 U/L (ref 0–37)
Albumin: 4.4 g/dL (ref 3.5–5.2)
Alkaline Phosphatase: 47 U/L (ref 39–117)
BILIRUBIN TOTAL: 2.7 mg/dL — AB (ref 0.2–1.2)
Bilirubin, Direct: 0.4 mg/dL — ABNORMAL HIGH (ref 0.0–0.3)
Total Protein: 7.3 g/dL (ref 6.0–8.3)

## 2014-11-02 NOTE — Addendum Note (Signed)
Addended by: Johnella MoloneyFUNDERBURK, Kire Ferg A on: 11/02/2014 02:25 PM   Modules accepted: Orders

## 2014-11-03 ENCOUNTER — Encounter: Payer: Self-pay | Admitting: Internal Medicine

## 2014-11-04 ENCOUNTER — Ambulatory Visit (INDEPENDENT_AMBULATORY_CARE_PROVIDER_SITE_OTHER): Payer: 59 | Admitting: Family Medicine

## 2014-11-04 VITALS — BP 102/70 | HR 63 | Temp 97.4°F | Ht 68.0 in | Wt 133.3 lb

## 2014-11-04 DIAGNOSIS — B399 Histoplasmosis, unspecified: Secondary | ICD-10-CM | POA: Insufficient documentation

## 2014-11-04 DIAGNOSIS — M549 Dorsalgia, unspecified: Secondary | ICD-10-CM

## 2014-11-04 DIAGNOSIS — R17 Unspecified jaundice: Secondary | ICD-10-CM

## 2014-11-04 NOTE — Progress Notes (Signed)
Pre visit review using our clinic review tool, if applicable. No additional management support is needed unless otherwise documented below in the visit note. 

## 2014-11-04 NOTE — Progress Notes (Signed)
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  Past Medical History  Diagnosis Date  . Asthma   . 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 Wisdom tooth extraction  2004  . Removal of blocked salivary gland in lip  2008    Family History  Problem Relation Age of Onset  . Arthritis Maternal Grandfather   . Hyperlipidemia Maternal Grandfather   . Heart disease Maternal Grandfather   . Arthritis Maternal Grandmother   . Hyperlipidemia Maternal Grandmother   . Hypertension Maternal Grandmother   . Hyperlipidemia Mother   . Heart disease Maternal Uncle   . Hypertension Father   . Cancer Father     basal cell skin cancer    History   Social History  . Marital Status: Single    Spouse Name: N/A  . Number of Children: N/A  . Years of Education: N/A   Social History Main Topics  . Smoking status: Never Smoker   . Smokeless tobacco: Never Used  . Alcohol Use: Yes     Comment: 1 drink rarely  . Drug Use: No  . Sexual Activity: Not Currently     Comment: never sexually active   Other Topics Concern  . Not on file   Social History Narrative   Work or School: working at [employer] as Engineer, civil (consulting) in step down ICU      Home Situation: lives alone      Spiritual Beliefs: Roman Catholic      Lifestyle: goes to the gym about 3-4 times per week, eats healthy               Current outpatient prescriptions:  .  albuterol (PROVENTIL HFA) 108 (90 BASE) MCG/ACT inhaler, Inhale 2 puffs into the lungs every 6 (six) hours as needed for wheezing., Disp: , Rfl:  .  Cholecalciferol (VITAMIN D PO), Take by mouth daily., Disp: , Rfl:  .  FOLIC ACID PO, Take by mouth., Disp: , Rfl:   EXAM:  Filed Vitals:   11/04/14 0856  BP: 102/70  Pulse: 63  Temp: 97.4 F (36.3 C)    Body mass index is 20.27 kg/(m^2).  GENERAL: vitals reviewed and listed above, alert, oriented, appears well hydrated and in no acute distress  HEENT: atraumatic, conjunttiva clear, no obvious abnormalities on inspection of external nose and ears  NECK: no obvious masses on inspection  LUNGS: clear to auscultation bilaterally, no wheezes, rales or rhonchi, good air movement  CV: HRRR, no peripheral edema  MS: moves all extremities without noticeable abnormality  PSYCH: pleasant and cooperative, no obvious depression or anxiety  ASSESSMENT AND PLAN:  Discussed the following assessment and plan:  Bilateral back pain, unspecified location  Histoplasmosis  Elevated bilirubin  -back pain resolved, cont HEP to prevent re-injury -elevated bili- suspected gilberts in the past, with intermittent abd pain, increased levels she opted  to see GI for eval -Patient advised to return or notify a doctor immediately if symptoms worsen or persist or new concerns arise.  There are no Patient Instructions on file for this visit.   Kriste BasqueKIM, Corky Blumstein R.

## 2014-12-13 ENCOUNTER — Other Ambulatory Visit (INDEPENDENT_AMBULATORY_CARE_PROVIDER_SITE_OTHER): Payer: 59

## 2014-12-13 ENCOUNTER — Ambulatory Visit (INDEPENDENT_AMBULATORY_CARE_PROVIDER_SITE_OTHER): Payer: 59 | Admitting: Internal Medicine

## 2014-12-13 ENCOUNTER — Encounter: Payer: Self-pay | Admitting: Internal Medicine

## 2014-12-13 DIAGNOSIS — R110 Nausea: Secondary | ICD-10-CM

## 2014-12-13 DIAGNOSIS — R195 Other fecal abnormalities: Secondary | ICD-10-CM

## 2014-12-13 DIAGNOSIS — R630 Anorexia: Secondary | ICD-10-CM

## 2014-12-13 LAB — HEPATIC FUNCTION PANEL
ALT: 17 U/L (ref 0–35)
AST: 17 U/L (ref 0–37)
Albumin: 4.5 g/dL (ref 3.5–5.2)
Alkaline Phosphatase: 45 U/L (ref 39–117)
Bilirubin, Direct: 0.1 mg/dL (ref 0.0–0.3)
Total Bilirubin: 0.9 mg/dL (ref 0.2–1.2)
Total Protein: 7.3 g/dL (ref 6.0–8.3)

## 2014-12-13 NOTE — Patient Instructions (Signed)
Your physician has requested that you go to the basement for lab work before leaving today. CC:  Kriste BasqueHannah Kim MD

## 2014-12-13 NOTE — Progress Notes (Signed)
  Referred by: Terressa KoyanagiKim, Hannah R, DO  Subjective:    Patient ID: Laura PagesMelissa A Schmiesing, female    DOB: Aug 29, 1990, 24 y.o.   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 More recently she had a mild elevation in the direct bilirubin as well. These labs are flowed in the EMR and reviewed. She never notices jaundice icterus or dark urine. She has some occasional anorexia and nausea loose stools that she seems to associate with occurring after working the night shift and this resolves after a good days rest. The problems have been occurring in the last year or 2. She can go months without symptoms. Wt Readings from Last 3 Encounters:  12/13/14 132 lb 6 oz (60.045 kg)  11/04/14 133 lb 4.8 oz (60.464 kg)  10/07/14 135 lb 8 oz (61.462 kg)   she has a history of findings consistent with histoplasmosis. She lived in [place] and then went to college at [employer]  Medications, allergies, past medical history, past surgical history, family history and social history are reviewed and updated in the EMR.   Review of Systems as per history of present illness. Some fatigue allergy problems.    Objective:   Physical Exam @BP  110/80 mmHg  Pulse 72  Ht 5' 7.5" (1.715 m)  Wt 132 lb 6 oz (60.045 kg)  BMI 20.41 kg/m2  LMP 11/26/2014@  General:  Well-developed, well-nourished and in no acute distress Eyes:  anicteric.  Lungs: Clear to auscultation bilaterally. Heart:  S1S2, no rubs, murmurs, gallops. Abdomen:  soft, non-tender, no hepatosplenomegaly, hernia, or mass and BS+.  Lymph:  Tiny right supraclavicular node.     Data Reviewed: Labs, PCP notes 2015-16 in EMR 2014 abdominal ultrasound normal liver gallbladder bile duct. Does have splenic calcifications consistent with granulomata likely related to prior histoplasmosis    Assessment & Plan:   1. Gilbert syndrome   2.  Hyperbilirubinemia unconjugated   3. Nausea without vomiting   4. Loose stools   5. 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 Will recheck LFTs today. I'm not sure why she had a mild increase in her direct bilirubin the last time. Pattern is overall consistent with that.  Her other symptoms are intermittent and not persistent and I don't think they show a need for further workup at this time given the fact that she can go for months without these. Perhaps a form of IBS. Seems work-related. If they persist or worsen we can consider additional workup. She is comfortable with this plan.  I will notify her of her LFT results through my chart.  I appreciate the opportunity to care for this patient. CC: Terressa KoyanagiKIM, HANNAH R., DO

## 2014-12-14 NOTE — Progress Notes (Signed)
Quick Note:  LFT's back to [facility] My Chart note to pt Dx still Gilbert's ? If stressed somehow at times of other labs ______

## 2014-12-20 IMAGING — CT CT NECK W/ CM
4 of 6 series · 17 of 33 positions shown, 18 images · non-contrast
Comparison: None.

CT NECK

CLINICAL DATA: Right-sided neck and chest lymphadenopathy,
fatigue, normal needle biopsy, occasional neck pain

CT NECK AND CHEST WITHOUT CONTRAST
TECHNIQUE: Multidetector CT imaging of the neck and chest was
performed using the standard protocol without intravenous contrast.

[Series 3: chest 5.0 st · axial · 0.63mm/px · z∈[-600,-430]mm · 4 of 58 slices shown, 5 images]
[im 12/58  soft-tissue]
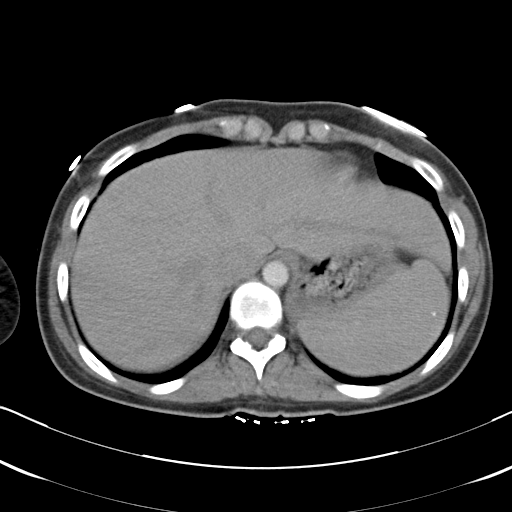
[im 12/58  bone]
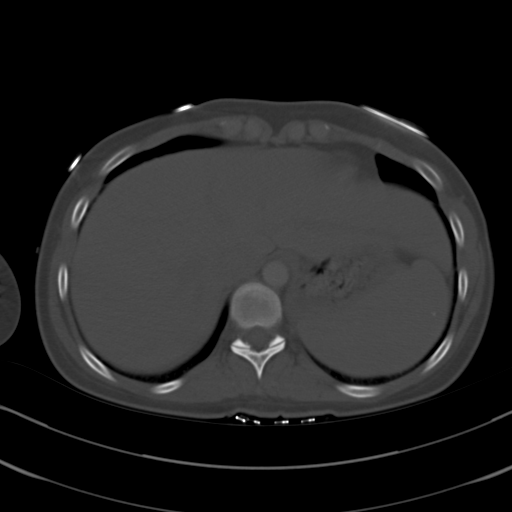
[im 23/58  bone]
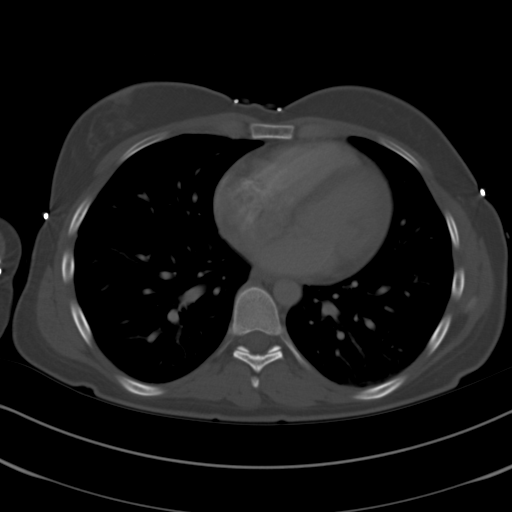
[im 35/58  bone]
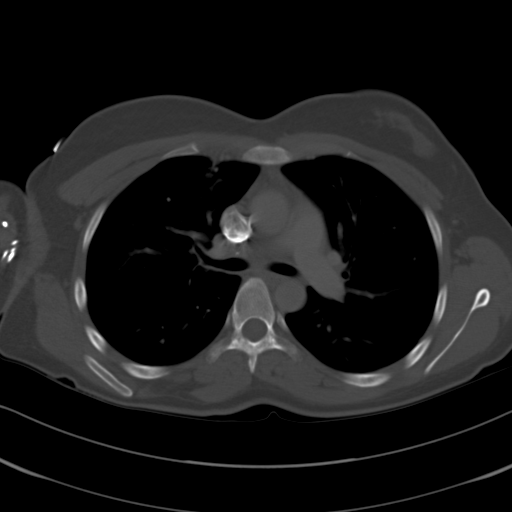
[im 46/58  bone]
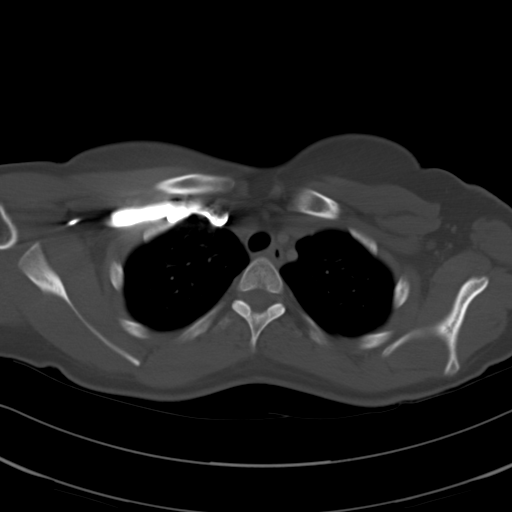

[Series 6: coronals cor · coronal · 0.56mm/px · 2 of 103 slices shown]
[im 35/103  bone]
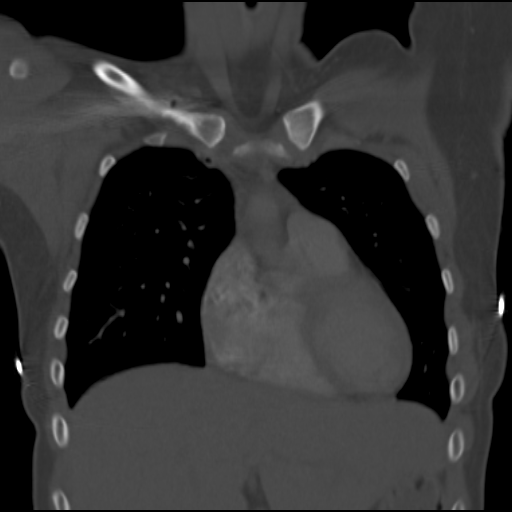
[im 69/103  bone]
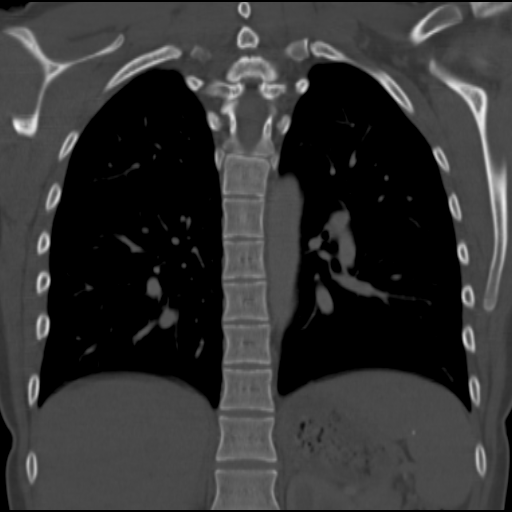

[Series 7: sagittals sag · sagittal · 0.56mm/px · 5 of 128 slices shown]
[im 32/128  bone]
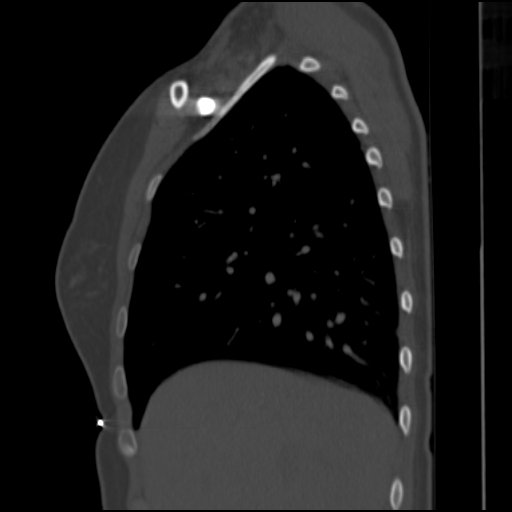
[im 48/128  bone]
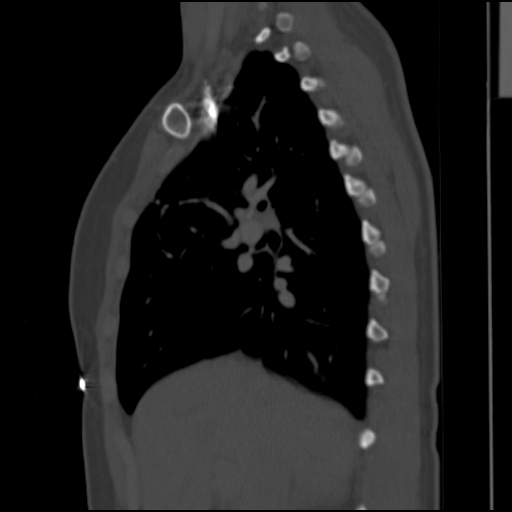
[im 64/128  bone]
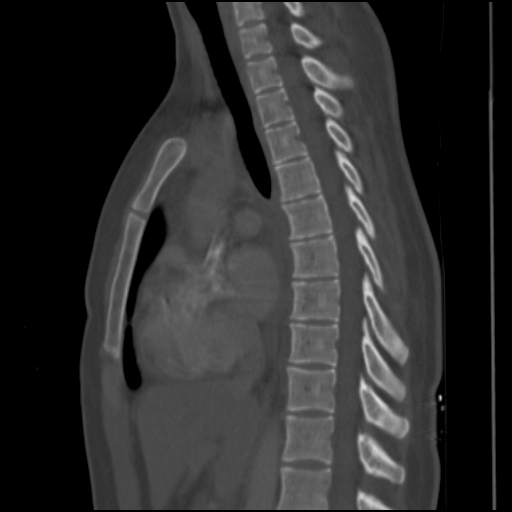
[im 80/128  bone]
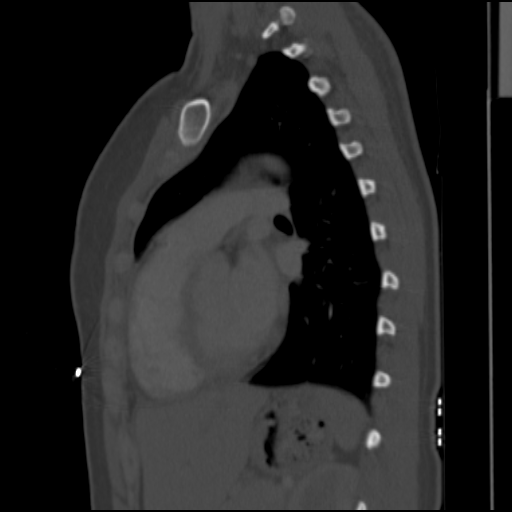
[im 96/128  bone]
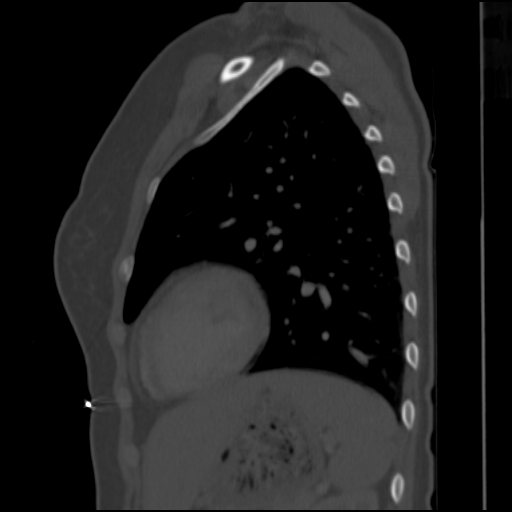

[Series 8: st neck 2.0 b31s · axial · 0.35mm/px · z∈[-354,-240]mm · 6 of 81 slices shown]
[im 12/81  bone]
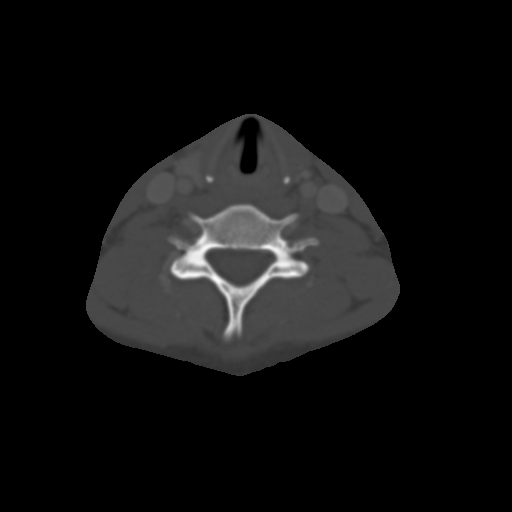
[im 23/81  bone]
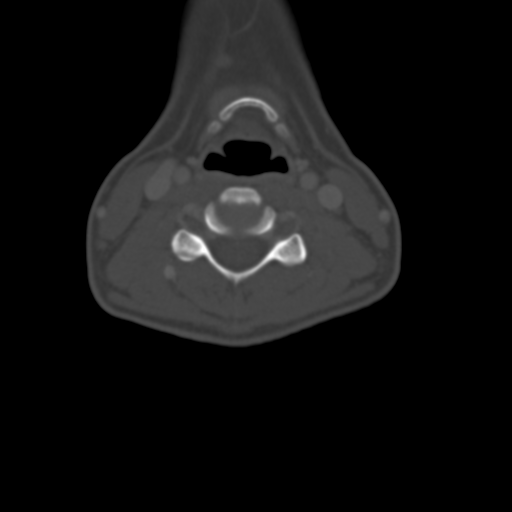
[im 35/81  bone]
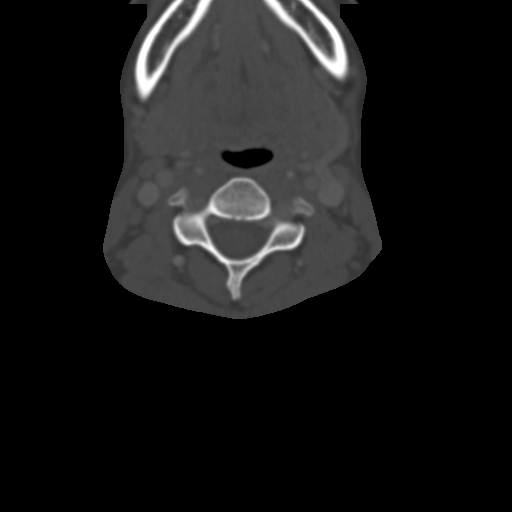
[im 46/81  bone]
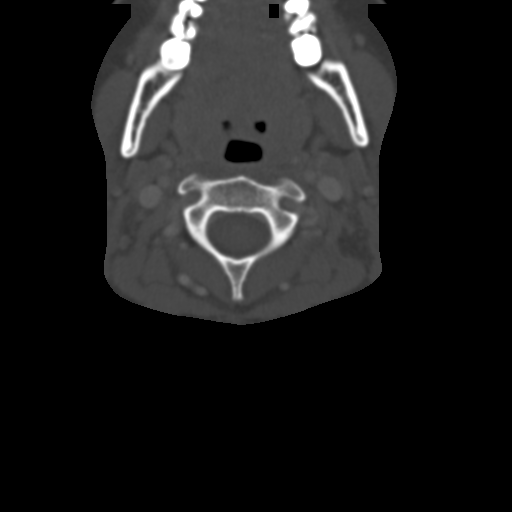
[im 58/81  bone]
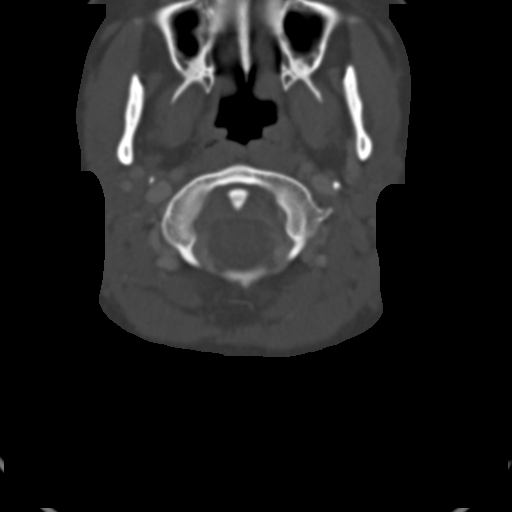
[im 69/81  bone]
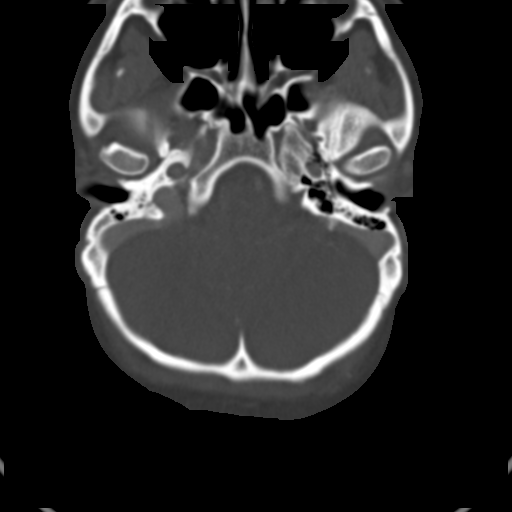

[17 of 33 positions shown; findings below may reference images not displayed]

FINDINGS: Pharyngeal soft tissues are unremarkable. Airway remains
patent.

Small bilateral level IIA lymph nodes measuring up to 6 mm short
axis (series 8/images and 72), within normal limits.

Visualized brain parenchyma is unremarkable.

The visualized paranasal sinuses are essentially clear. The mastoid
air cells are unopacified.

Cervical spine is within normal limits.

Visualized thyroid is unremarkable.
IMPRESSION: Small bilateral cervical lymph nodes measuring up to 6 mm short
axis, within normal limits.

CT CHEST
FINDINGS: Lungs are essentially clear.  No suspicious pulmonary
nodules. Mild tree-in-bud nodularity in the medial right upper lobe
(series 4/images 20-24), likely postinfectious/inflammatory.  Mild
dependent atelectasis in the left lower lobe.  No pleural effusion
or pneumothorax.

The heart is normal in size.  No pericardial effusion.

10 mm short axis partially calcified right supraclavicular node
(series 3/image 9).  14 mm short axis partially calcified right
paratracheal node (series 3/image 19).  11 mm short axis partially
calcified right hilar node (series 3/image 26).

Visualized upper abdomen is notable for calcified splenic
granulomata.

Visualized osseous structures are within normal limits.
IMPRESSION: Borderline enlarged right supraclavicular, right paratracheal, and
right hilar lymph nodes, partially calcified, likely sequela of
prior granulomatous disease.

Associated calcified splenic granulomata.

## 2015-04-12 IMAGING — US US ABDOMEN COMPLETE
1 series · 13 of 25 positions shown · non-contrast
Comparison: None

CLINICAL DATA: Left-sided abdominal pain

ABDOMINAL ULTRASOUND COMPLETE

[Series 1: us abdomen complete · 0.28mm/px · 13 of 76 slices shown]
[im 1/76]
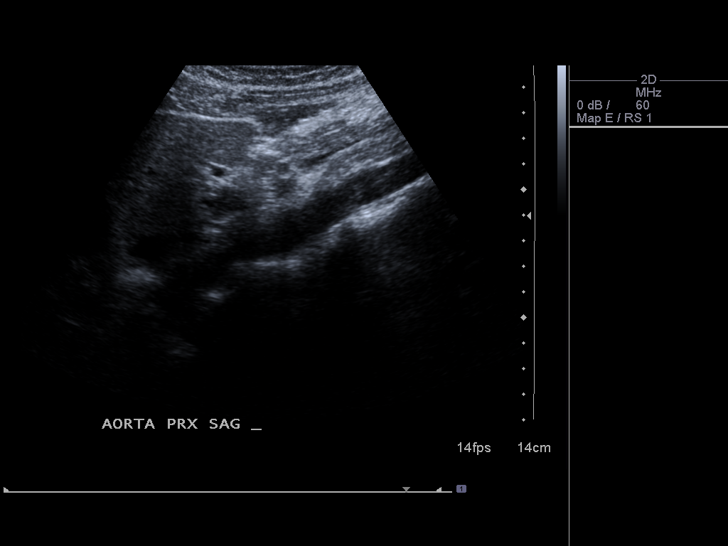
[im 7/76]
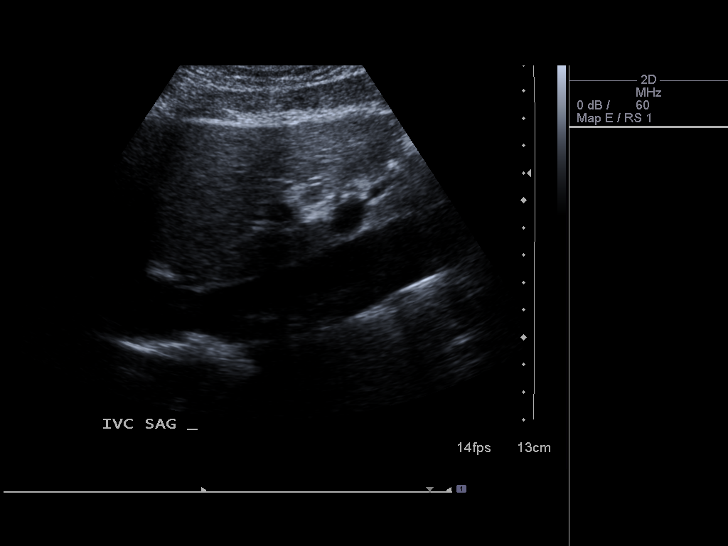
[im 13/76]
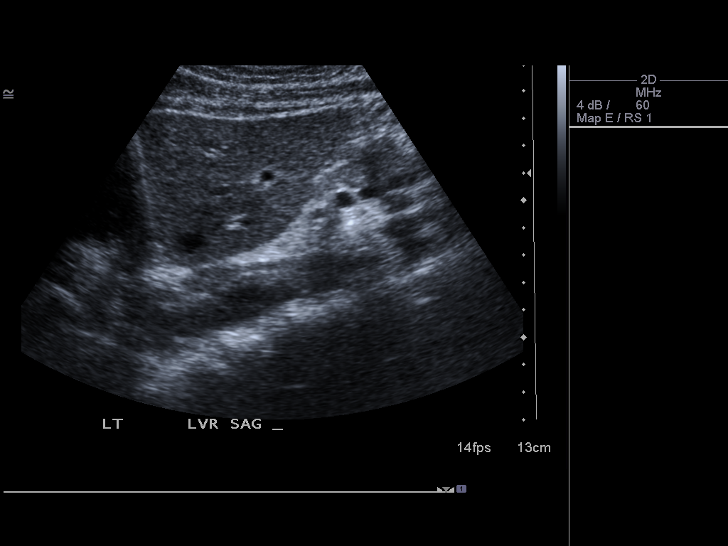
[im 19/76]
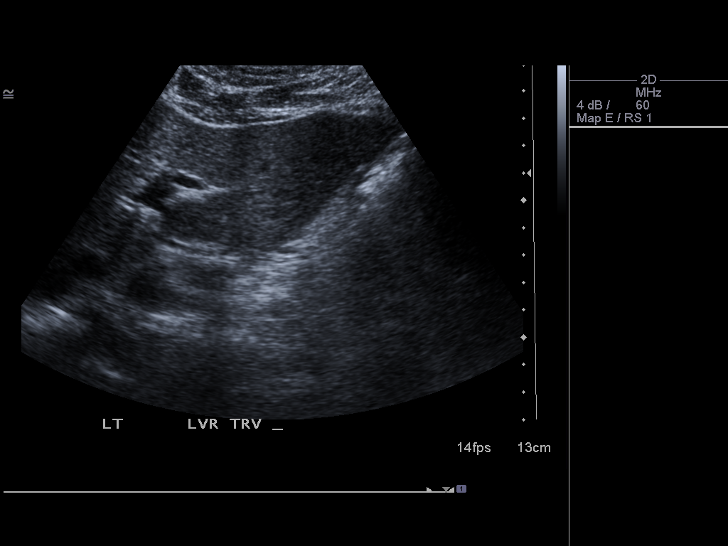
[im 26/76]
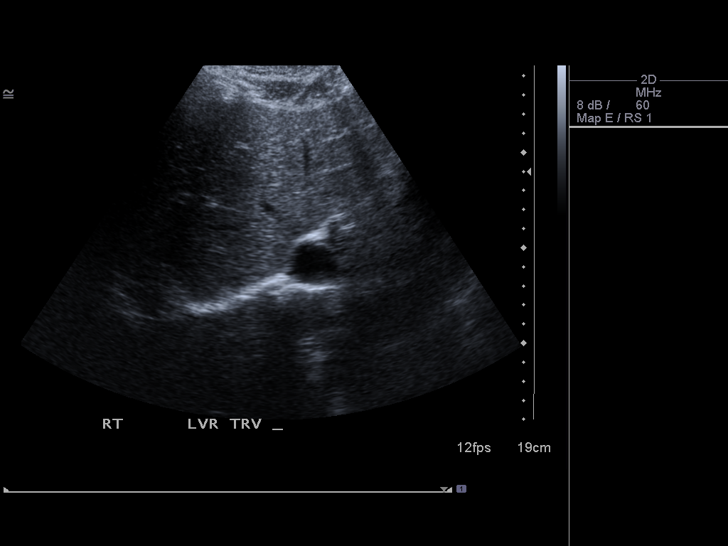
[im 32/76]
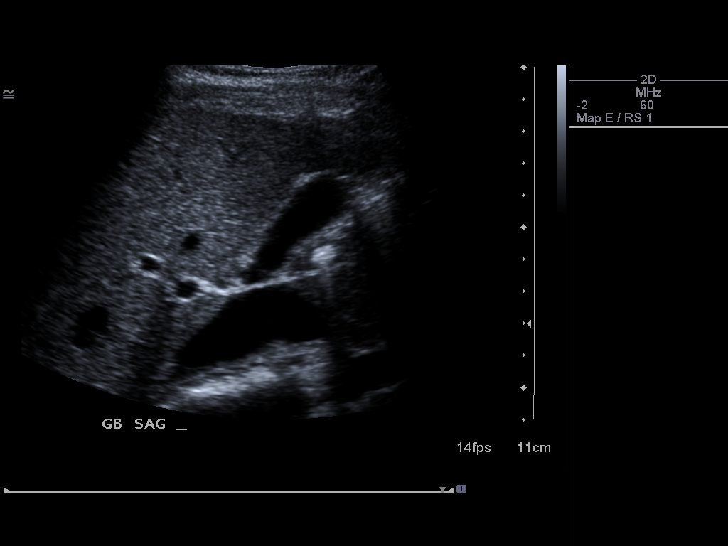
[im 38/76]
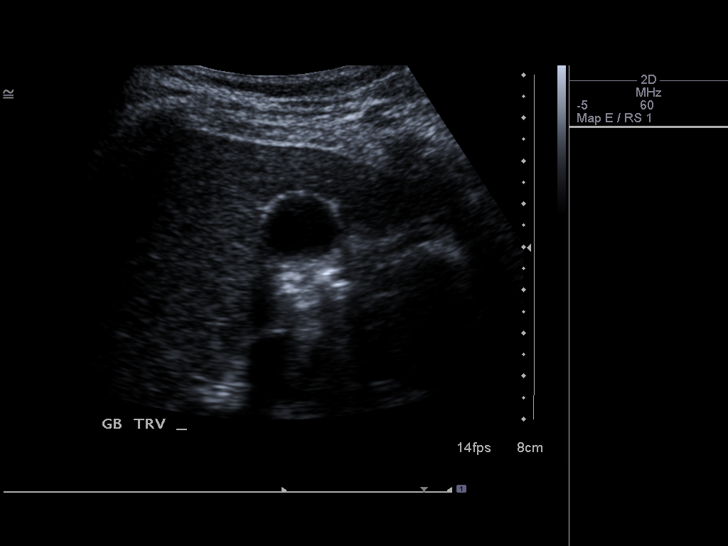
[im 44/76]
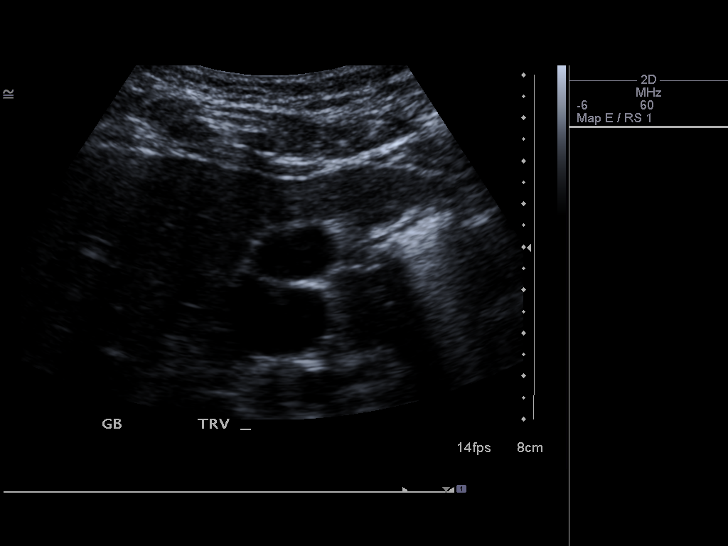
[im 51/76]
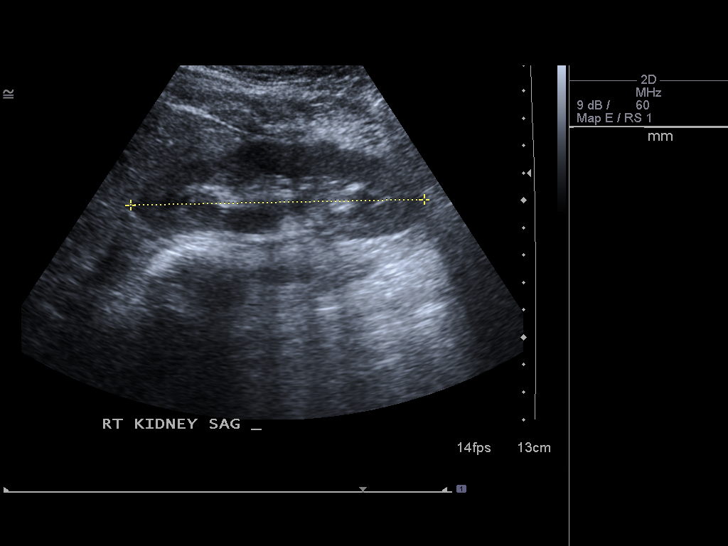
[im 57/76]
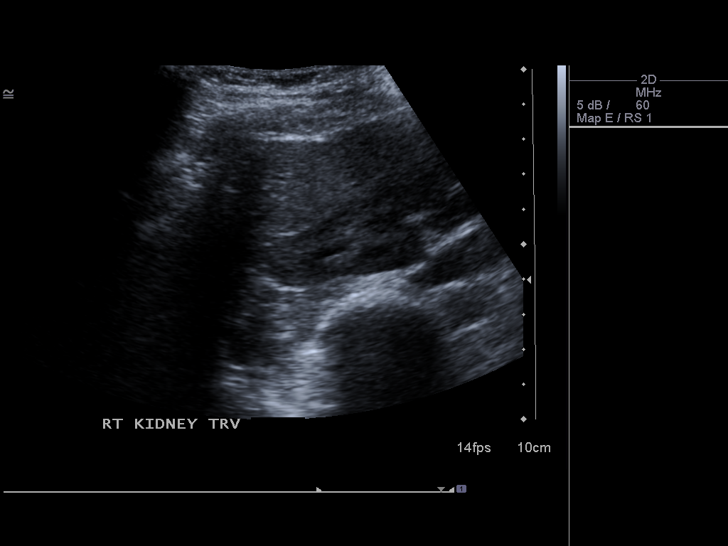
[im 63/76]
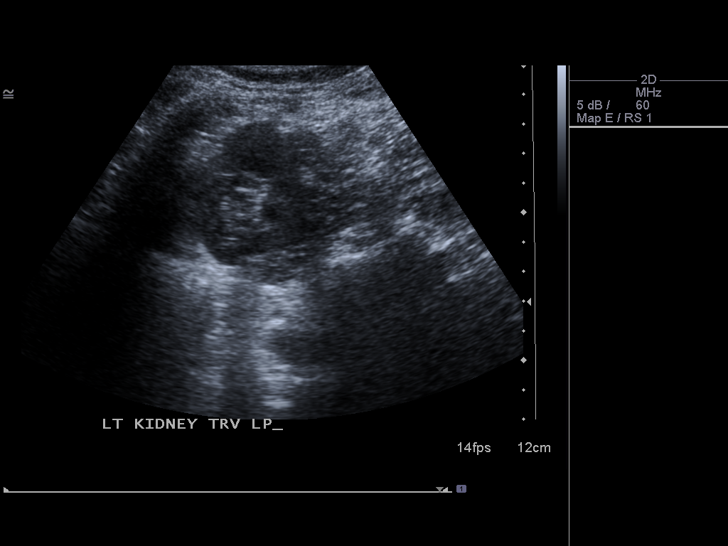
[im 69/76]
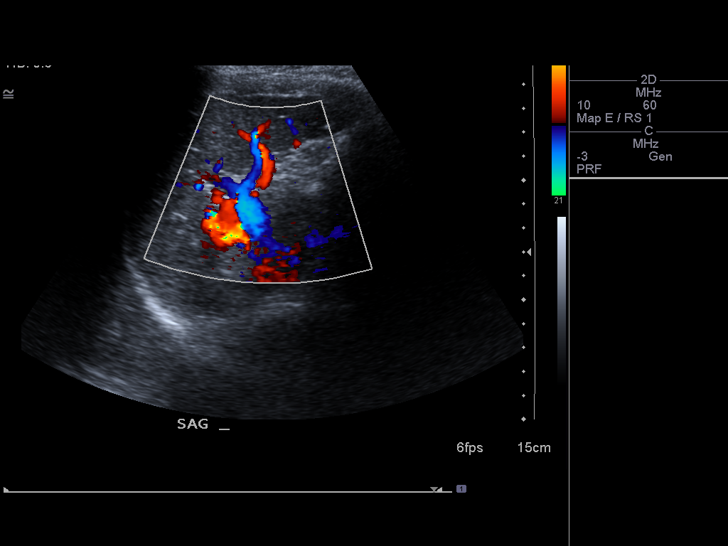
[im 76/76]
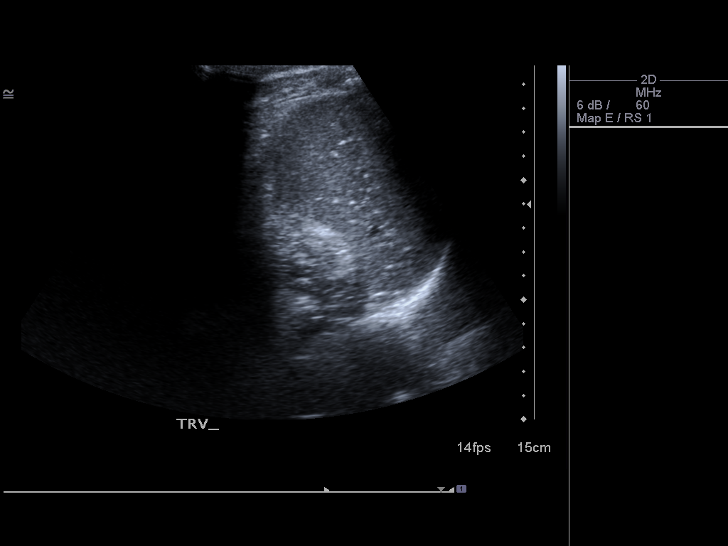

[13 of 25 positions shown; findings below may reference images not displayed]

FINDINGS: Gallbladder:  No gallstones, gallbladder wall thickening, or
pericholecystic fluid.

Common Bile Duct:  Within normal limits in caliber.

Liver: No focal mass lesion identified.  Within normal limits in
parenchymal echogenicity.

IVC:  Largely obscured by overlying bowel gas.

Pancreas:  Largely obscured by overlying bowel gas.

Spleen:  Multiple punctate echogenic foci throughout the spleen.
No definite shadowing.  The spleen is within normal limits for size
at 8.2 cm in length.

Right kidney:  Normal in size (10.7 cm) and parenchymal
echogenicity.  No evidence of mass or hydronephrosis.

Left kidney:  Normal in size (9.8 cm) and parenchymal echogenicity.
No evidence of mass or hydronephrosis.

Abdominal Aorta:  No aneurysm identified.
IMPRESSION: 1.  No acute abnormality.

2.  Numerous small echogenic foci scattered throughout the splenic
parenchyma.  Statistically, these likely reflect the stigmata of
prior granulomatous disease/infection.  Additional less likely
differential considerations include hemangiomatosis, Tiger cell
angioma, sequela of sickle cell anemia or paroxysmal nocturnal
hemoglobinuria.

Given that the patient's pain is left-sided, consider further
evaluation with MRI of the abdomen with and without contrast for
further evaluation.

## 2017-05-16 ENCOUNTER — Encounter: Payer: Self-pay | Admitting: Family Medicine

## 2017-09-05 ENCOUNTER — Encounter: Payer: Self-pay | Admitting: Family Medicine
